# Patient Record
Sex: Male | Born: 2000 | Race: Black or African American | Hispanic: No | Marital: Single | Smoking: Never smoker
Health system: Southern US, Community
[De-identification: ages and names within clinical notes are randomized; demographics above are authoritative.]

---
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
## 2021-08-15 ENCOUNTER — Ambulatory Visit (LOCAL_COMMUNITY_HEALTH_CENTER): Payer: BC Managed Care – PPO

## 2021-08-15 ENCOUNTER — Other Ambulatory Visit: Payer: Self-pay

## 2021-08-15 DIAGNOSIS — Z23 Encounter for immunization: Secondary | ICD-10-CM | POA: Diagnosis not present

## 2021-08-15 DIAGNOSIS — Z719 Counseling, unspecified: Secondary | ICD-10-CM

## 2021-08-15 NOTE — Progress Notes (Signed)
?  Are you feeling sick today? No ? ? ?Have you ever received a dose of COVID-19 Vaccine? [employer], [place], [place], Novavax, Other) No ? ?If yes, which vaccine?  ?How many dose of COVID-19 vaccine were administered?    ? ? ?Did you bring the vaccination record card or other documentation?  No ? ? ?Does the person to be vaccinated have a health condition or is undergoing treatment that makes them moderately or severely immunocompromised? This would include, but not be limited to, treatment for cancer, HIV, receipt of organ transplant, immunosuppressive therapy or high-dose corticosteroids, CAR-T-cell therapy, hematopoietic cell transplant [HCT], or moderate or severe primary immunodeficiency.  No ? ?Has the person to be vaccinated received COVID-19 vaccine before or during hematopoietic cell transplant (HCT) or CAR-T-cell therapies? No ? ?Has the person to be vaccinated ever had an allergic reaction to: (This would include a severe allergic reaction [e.g., anaphylaxis] that required treatment with epinephrine or EpiPen? or that caused you to go to the hospital. It would also include an allergic reaction that caused hives, swelling, or respiratory distress, including wheezing.) A component of a COVID-19 vaccine or a previous dose of COVID-19 vaccine? No ? ? ?Has the person to be vaccinated ever had an allergic reaction to another vaccine (other thanCOVID-19 vaccine) or an injectable medication? (This would include a severe allergic reaction [e.g., anaphylaxis] that required treatment with epinephrine or EpiPen? or that caused youto go to the hospital. It would also include an allergic reaction that caused hives, swelling, or respiratory distress, including wheezing.)   No ?  ?Do you have a history of any of the following: ? ?Myocarditis or Pericarditis No ?Have a history of thrombosis with thrombocytopenia syndrome (TTS) No ?Multisystem Inflammatory Syndrome (MIS-C or MIS-A)? No ?Immune-mediate syndrome defined by  thrombosis and thrombocytopenia, such as heparin--induced thrombocytopenia (HIT)  No ?Have a history of Guillain-Barr? Syndrome (GBS) No ?Have a history of COVID-19 disease within the past 3 months? No ?Vaccinated with monkeypox vaccine in the last 4 weeks? No ? ?Pt came in to clinic for first covid vaccine. Pt prefers to have [place] after offering other options. Administered Janssen to (L) deltoid, monitored pt and tolerated well. Given a copy of covid 19 janssen vaccine information, record card and updated [facility]. M.Cattie Tineo, LPN ? ?  ?

## 2024-03-16 ENCOUNTER — Ambulatory Visit: Payer: Self-pay

## 2024-03-16 NOTE — Telephone Encounter (Signed)
 FYI Only or Action Required?: FYI only for provider.  Patient was last seen in primary care on unknown.  Called Nurse Triage reporting Hypertension.  Symptoms began yesterday.  Interventions attempted: Nothing.  Symptoms are: gradually worsening.  Triage Disposition: Go to ED Now (Notify PCP)  Patient/caregiver understands and will follow disposition?: Yes      Copied from CRM [id number]. Topic: Clinical - Red Word Triage >> Mar 16, 2024 12:22 PM Amy B wrote: Red Word that prompted transfer to Nurse Triage: High blood pressure 174/102.  Sent home from work, with nausea. Reason for Disposition  [1] Systolic BP >= 160 OR Diastolic >= 100 AND [2] cardiac (e.g., breathing difficulty, chest pain) or neurologic symptoms (e.g., new-onset blurred or double vision, unsteady gait)  Answer Assessment - Initial Assessment Questions This RN spoke with the patient's spouse regarding symptoms. New patient appointment established. Pt referred to ED for acute symptoms.   1. BLOOD PRESSURE: What is your blood pressure? Did you take at least two measurements 5 minutes apart?     Last night reading 172/106 , Today 174/102  2. ONSET: When did you take your blood pressure?     Today at work  3. HOW: How did you take your blood pressure? (e.g., automatic home BP monitor, visiting nurse)     At job  4. HISTORY: Do you have a history of high blood pressure?     No none history  5. MEDICINES: Are you taking any medicines for blood pressure? Have you missed any doses recently?     Not currently on BP meds  6. OTHER SYMPTOMS: Do you have any symptoms? (e.g., blurred vision, chest pain, difficulty breathing, headache, weakness)     Chest pain, abdominal pain and shortness of breath  Protocols used: Blood Pressure - High-A-AH

## 2024-03-25 ENCOUNTER — Encounter: Payer: Self-pay | Admitting: Nurse Practitioner

## 2024-05-18 ENCOUNTER — Encounter: Payer: Self-pay | Admitting: Nurse Practitioner

## 2024-05-18 ENCOUNTER — Ambulatory Visit: Admitting: Nurse Practitioner

## 2024-05-18 VITALS — BP 138/96 | HR 59 | Temp 98.0°F | Ht 66.0 in | Wt 237.0 lb

## 2024-05-18 DIAGNOSIS — Z1159 Encounter for screening for other viral diseases: Secondary | ICD-10-CM

## 2024-05-18 DIAGNOSIS — I10 Essential (primary) hypertension: Secondary | ICD-10-CM | POA: Diagnosis not present

## 2024-05-18 DIAGNOSIS — Z114 Encounter for screening for human immunodeficiency virus [HIV]: Secondary | ICD-10-CM | POA: Diagnosis not present

## 2024-05-18 DIAGNOSIS — E66812 Obesity, class 2: Secondary | ICD-10-CM | POA: Diagnosis not present

## 2024-05-18 DIAGNOSIS — Z6838 Body mass index (BMI) 38.0-38.9, adult: Secondary | ICD-10-CM | POA: Diagnosis not present

## 2024-05-18 DIAGNOSIS — Z7689 Persons encountering health services in other specified circumstances: Secondary | ICD-10-CM | POA: Diagnosis not present

## 2024-05-18 DIAGNOSIS — Z13220 Encounter for screening for lipoid disorders: Secondary | ICD-10-CM | POA: Diagnosis not present

## 2024-05-18 DIAGNOSIS — Z131 Encounter for screening for diabetes mellitus: Secondary | ICD-10-CM | POA: Diagnosis not present

## 2024-05-18 DIAGNOSIS — Z130 Encounter for screening for diseases of the blood and blood-forming organs and certain disorders involving the immune mechanism: Secondary | ICD-10-CM

## 2024-05-18 MED ORDER — AMLODIPINE BESYLATE 5 MG PO TABS: 5.0000 mg | ORAL_TABLET | Freq: Every day | ORAL | 0 refills | Status: DC

## 2024-05-18 NOTE — Progress Notes (Signed)
 BP (!) 138/96   Pulse (!) 59   Temp 98 F (36.7 C)   Ht 5' 6 (1.676 m)   Wt 237 lb (107.5 kg)   SpO2 98%   BMI 38.25 kg/m    Subjective:    Patient ID: Shawn Walker, male    DOB: 2001/01/16, 23 y.o.   MRN: [id number]  HPI: Shawn Walker is a 23 y.o. male  Chief Complaint  Patient presents with   Establish Care    Pt c/o hypertension.    Discussed the use of AI scribe software for clinical note transcription with the patient, who gave verbal consent to proceed.  History of Present Illness Shawn Walker is a 23 year old male with obesity and hypertension who presents to establish care.  Hypertension - Blood pressure readings consistently elevated, with initial in-office reading of 210/120 mmHg, later improving to 138/96 mmHg during the visit - Home blood pressure readings as high as 191/unknown and 171/unknown, with fluctuations throughout the day - Occasionally experiences feeling 'weird,' but often asymptomatic despite elevated blood pressure  Obesity - Current weight 237 pounds - Body mass index (BMI) 38.25 Wt Readings from Last 3 Encounters:  05/18/24 237 lb (107.5 kg)   Body mass index is 38.25 kg/m.  Flowsheet Row Office Visit from 05/18/2024 in [facility]  1 38 inches   - Encourage continuation of lifestyle modifications, including dietary management and regular exercise. -continue to increase physical activity, getting at least 150 min of physical activity a week.  Work on including runner, broadcasting/film/video 2 days a week.  - continue eating at a calorie deficit 2000-2200 cal a day, eating a well balanced diet with whole foods, avoiding processed foods.   Patient is motivated to continue working on lifestyle modification.           05/18/2024    9:14 AM  Depression screen PHQ 2/9  Decreased Interest 0  Down, Depressed, Hopeless 0  PHQ - 2 Score 0    Relevant past medical, surgical, family and social history reviewed and updated  as indicated. Interim medical history since our last visit reviewed. Allergies and medications reviewed and updated.  Review of Systems Constitutional: Negative for fever or weight change.  Respiratory: Negative for cough and shortness of breath.   Cardiovascular: Negative for chest pain or palpitations.  Gastrointestinal: Negative for abdominal pain, no bowel changes.  Musculoskeletal: Negative for gait problem or joint swelling.  Skin: Negative for rash.  Neurological: Negative for dizziness or headache.  No other specific complaints in a complete review of systems (except as listed in HPI above).      Objective:      BP (!) 138/96   Pulse (!) 59   Temp 98 F (36.7 C)   Ht 5' 6 (1.676 m)   Wt 237 lb (107.5 kg)   SpO2 98%   BMI 38.25 kg/m    Wt Readings from Last 3 Encounters:  05/18/24 237 lb (107.5 kg)    Physical Exam VITALS: BP- 138/96 MEASUREMENTS: Weight- 237, BMI- 38.25. GENERAL: Alert, cooperative, well developed, no acute distress HEENT: Normocephalic, normal oropharynx, moist mucous membranes CHEST: Clear to auscultation bilaterally, no wheezes, rhonchi, or crackles CARDIOVASCULAR: Normal heart rate and rhythm, S1 and S2 normal without murmurs ABDOMEN: Soft, non-tender, non-distended, without organomegaly, normal bowel sounds EXTREMITIES: No cyanosis or edema NEUROLOGICAL: Cranial nerves grossly intact, moves all extremities without gross motor or sensory deficit  No results found for  this or any previous visit.        Assessment & Plan:   Problem List Items Addressed This Visit       Cardiovascular and Mediastinum   Primary hypertension - Primary   Relevant Medications   amLODipine  (NORVASC ) 5 MG tablet   Other Relevant Orders   Microalbumin / creatinine urine ratio   Aldosterone + renin activity w/ ratio   EKG 12-Lead     Other   Class 2 severe obesity due to excess calories with serious comorbidity and body mass index (BMI) of 38.0 to 38.9  in adult   Relevant Orders   TSH   Other Visit Diagnoses       Screening for deficiency anemia       Relevant Orders   CBC with Differential/Platelet     Screening for cholesterol level       Relevant Orders   Lipid panel     Screening for diabetes mellitus       Relevant Orders   Comprehensive metabolic panel with GFR   Hemoglobin A1c     Encounter for hepatitis C screening test for low risk patient       Relevant Orders   Hepatitis C antibody     Screening for HIV without presence of risk factors       Relevant Orders   HIV Antibody (routine testing w rflx)     Encounter to establish care            Assessment and Plan Assessment & Plan Primary hypertension Hypertension with initial blood pressure of 210/120 mmHg, improved to 138/96 mmHg. Family history of hypertension. Asymptomatic but at risk for cardiovascular damage. EKG showed sinus bradycardia but otherwise normal. - Prescribed low-dose amlodipine  to reduce cardiac workload. - Ordered blood work at [facility]. - Performed EKG to establish baseline cardiac function. - Scheduled follow-up appointment in 4 weeks to reassess blood pressure and review blood work results.  Class 2 severe obesity BMI of 38.25, indicating class 2 severe obesity. - Encourage continuation of lifestyle modifications, including dietary management and regular exercise. -continue to increase physical activity, getting at least 150 min of physical activity a week.  Work on including runner, broadcasting/film/video 2 days a week.  - continue eating at a calorie deficit 2000-2200 cal a day, eating a well balanced diet with whole foods, avoiding processed foods.   Patient is motivated to continue working on lifestyle modification.    General health maintenance Discussion of the importance of monitoring blood pressure at home and the silent nature of hypertension. - Advised monitoring blood pressure at home.        Follow up plan: Return in about 4 weeks  (around 06/15/2024) for follow up blood pressure.

## 2024-05-27 LAB — COMPREHENSIVE METABOLIC PANEL WITH GFR
ALT: 26 IU/L (ref 0–44)
AST: 28 IU/L (ref 0–40)
Albumin: 4.9 g/dL (ref 4.3–5.2)
Alkaline Phosphatase: 58 IU/L (ref 47–123)
BUN/Creatinine Ratio: 13 (ref 9–20)
BUN: 15 mg/dL (ref 6–20)
Bilirubin Total: 0.9 mg/dL (ref 0.0–1.2)
CO2: 24 mmol/L (ref 20–29)
Calcium: 9.9 mg/dL (ref 8.7–10.2)
Chloride: 104 mmol/L (ref 96–106)
Creatinine, Ser: 1.2 mg/dL (ref 0.76–1.27)
Globulin, Total: 2.7 g/dL (ref 1.5–4.5)
Glucose: 91 mg/dL (ref 70–99)
Potassium: 4.6 mmol/L (ref 3.5–5.2)
Sodium: 141 mmol/L (ref 134–144)
Total Protein: 7.6 g/dL (ref 6.0–8.5)
eGFR: 88 mL/min/1.73

## 2024-05-27 LAB — CBC WITH DIFFERENTIAL/PLATELET
Basophils Absolute: 0 x10E3/uL (ref 0.0–0.2)
Basos: 0 %
EOS (ABSOLUTE): 0.1 x10E3/uL (ref 0.0–0.4)
Eos: 3 %
Hematocrit: 52.6 % — ABNORMAL HIGH (ref 37.5–51.0)
Hemoglobin: 17 g/dL (ref 13.0–17.7)
Immature Grans (Abs): 0 x10E3/uL (ref 0.0–0.1)
Immature Granulocytes: 0 %
Lymphocytes Absolute: 1.8 x10E3/uL (ref 0.7–3.1)
Lymphs: 33 %
MCH: 26.7 pg (ref 26.6–33.0)
MCHC: 32.3 g/dL (ref 31.5–35.7)
MCV: 83 fL (ref 79–97)
Monocytes Absolute: 0.5 x10E3/uL (ref 0.1–0.9)
Monocytes: 8 %
Neutrophils Absolute: 3.1 x10E3/uL (ref 1.4–7.0)
Neutrophils: 56 %
Platelets: 225 x10E3/uL (ref 150–450)
RBC: 6.36 x10E6/uL — ABNORMAL HIGH (ref 4.14–5.80)
RDW: 13.8 % (ref 11.6–15.4)
WBC: 5.5 x10E3/uL (ref 3.4–10.8)

## 2024-05-27 LAB — ALDOSTERONE + RENIN ACTIVITY W/ RATIO
Aldos/Renin Ratio: 1.1 (ref 0.0–30.0)
Aldosterone: 2.2 ng/dL (ref 0.0–30.0)
Renin Activity, Plasma: 2.029 ng/mL/h (ref 0.167–5.380)

## 2024-05-27 LAB — MICROALBUMIN / CREATININE URINE RATIO
Creatinine, Urine: 123.8 mg/dL
Microalb/Creat Ratio: <2 mg/g{creat} (ref 0–29)
Microalbumin, Urine: <3 ug/mL

## 2024-05-27 LAB — HIV ANTIBODY (ROUTINE TESTING W REFLEX): HIV Screen 4th Generation wRfx: NONREACTIVE

## 2024-05-27 LAB — LIPID PANEL
Chol/HDL Ratio: 4.2 ratio (ref 0.0–5.0)
Cholesterol, Total: 245 mg/dL — ABNORMAL HIGH (ref 100–199)
HDL: 58 mg/dL
LDL Chol Calc (NIH): 173 mg/dL — ABNORMAL HIGH (ref 0–99)
Triglycerides: 82 mg/dL (ref 0–149)
VLDL Cholesterol Cal: 14 mg/dL (ref 5–40)

## 2024-05-27 LAB — HEMOGLOBIN A1C
Est. average glucose Bld gHb Est-mCnc: 128 mg/dL
Hgb A1c MFr Bld: 6.1 % — ABNORMAL HIGH (ref 4.8–5.6)

## 2024-05-27 LAB — HEPATITIS C ANTIBODY: Hep C Virus Ab: NONREACTIVE

## 2024-05-27 LAB — TSH: TSH: 0.922 u[IU]/mL (ref 0.450–4.500)

## 2024-06-20 ENCOUNTER — Encounter: Payer: Self-pay | Admitting: Nurse Practitioner

## 2024-06-20 ENCOUNTER — Ambulatory Visit (INDEPENDENT_AMBULATORY_CARE_PROVIDER_SITE_OTHER): Admitting: Nurse Practitioner

## 2024-06-20 VITALS — BP 134/94 | HR 83 | Temp 98.0°F | Resp 18 | Ht 66.0 in | Wt 236.7 lb

## 2024-06-20 DIAGNOSIS — R051 Acute cough: Secondary | ICD-10-CM | POA: Diagnosis not present

## 2024-06-20 DIAGNOSIS — I10 Essential (primary) hypertension: Secondary | ICD-10-CM

## 2024-06-20 LAB — POC COVID19/FLU A&B COMBO
Covid Antigen, POC: NEGATIVE
Influenza A Antigen, POC: POSITIVE — AB
Influenza B Antigen, POC: NEGATIVE

## 2024-06-20 MED ORDER — HYDROCHLOROTHIAZIDE 12.5 MG PO TABS: 12.5000 mg | ORAL_TABLET | Freq: Every day | ORAL | 1 refills | Status: AC

## 2024-06-20 MED ORDER — AMLODIPINE BESYLATE 5 MG PO TABS: 5.0000 mg | ORAL_TABLET | Freq: Every day | ORAL | 1 refills | Status: AC

## 2024-06-20 MED ORDER — OSELTAMIVIR PHOSPHATE 75 MG PO CAPS: 75.0000 mg | ORAL_CAPSULE | Freq: Two times a day (BID) | ORAL | 0 refills | Status: AC

## 2024-06-20 NOTE — Progress Notes (Signed)
 "  BP (!) 134/94   Pulse 83   Temp 98 F (36.7 C)   Resp 18   Ht 5' 6 (1.676 m)   Wt 236 lb 11.2 oz (107.4 kg)   SpO2 98%   BMI 38.20 kg/m    Subjective:    Patient ID: Shawn Walker, male    DOB: [birth year], 24 y.o.   MRN: [id number]  HPI: Shawn Walker is a 24 y.o. male  Chief Complaint  Patient presents with   Medical Management of Chronic Issues   Hypertension   Discussed the use of AI scribe software for clinical note transcription with the patient, who gave verbal consent to proceed.  History of Present Illness [name] is a 24 year old male with hypertension who presents for a four-week follow-up.  Hypertension - Currently taking amlodipine  5 mg daily. - Home blood pressure readings average 138/96 mmHg, consistent with today's office reading. - Occasionally forgets morning dose due to busy schedule, resulting in increased awareness of heart pounding. - Resumes medication the following day and symptoms improve.  Acute upper respiratory symptoms - Onset of symptoms since Friday, including rhinorrhea and headache. - No fever or significant cough. - Initiated Tamiflu  early due to feeling unwell. - Acetaminophen provided temporary relief of headache. - Using Zycam (zinc supplement) with minimal effect.  Gastrointestinal symptoms - Experienced nausea after consuming pizza at a friend's house. - Attributes nausea to dairy intake while ill.  Impact on daily functioning - Works on his feet for approximately nine hours daily. - Concerned about the effect of illness on ability to work.         06/20/2024    9:08 AM 05/18/2024    9:14 AM  Depression screen PHQ 2/9  Decreased Interest 0 0  Down, Depressed, Hopeless 0 0  PHQ - 2 Score 0 0  Altered sleeping 0   Tired, decreased energy 0   Change in appetite 0   Feeling bad or failure about yourself  0   Trouble concentrating 0   Moving slowly or fidgety/restless 0   Suicidal thoughts 0   PHQ-9 Score 0    Difficult doing work/chores Not difficult at all     Relevant past medical, surgical, family and social history reviewed and updated as indicated. Interim medical history since our last visit reviewed. Allergies and medications reviewed and updated.  Review of Systems  Ten systems reviewed and is negative except as mentioned in HPI      Objective:      BP (!) 134/94   Pulse 83   Temp 98 F (36.7 C)   Resp 18   Ht 5' 6 (1.676 m)   Wt 236 lb 11.2 oz (107.4 kg)   SpO2 98%   BMI 38.20 kg/m    Wt Readings from Last 3 Encounters:  06/20/24 236 lb 11.2 oz (107.4 kg)  05/18/24 237 lb (107.5 kg)    Physical Exam VITALS: BP- 138/96 GENERAL: Alert, cooperative, well developed, no acute distress. HEENT: Normocephalic, normal oropharynx, moist mucous membranes. CHEST: Clear to auscultation bilaterally, no wheezes, rhonchi, or crackles. CARDIOVASCULAR: Normal heart rate and rhythm, S1 and S2 normal without murmurs. ABDOMEN: Soft, non-tender, non-distended, without organomegaly, normal bowel sounds. EXTREMITIES: No cyanosis or edema. NEUROLOGICAL: Cranial nerves grossly intact, moves all extremities without gross motor or sensory deficit.  Results for orders placed or performed in visit on 06/20/24  POC Covid19/Flu A&B Antigen   Collection Time: 06/20/24  9:34 AM  Result Value Ref Range   Influenza A Antigen, POC Positive (A) Negative   Influenza B Antigen, POC Negative Negative   Covid Antigen, POC Negative Negative          Assessment & Plan:   Problem List Items Addressed This Visit       Cardiovascular and Mediastinum   Primary hypertension   Relevant Medications   hydrochlorothiazide  (HYDRODIURIL ) 12.5 MG tablet   amLODipine  (NORVASC ) 5 MG tablet   Other Visit Diagnoses       Acute cough    -  Primary   Relevant Medications   oseltamivir  (TAMIFLU ) 75 MG capsule   Other Relevant Orders   POC Covid19/Flu A&B Antigen (Completed)        Assessment and  Plan Assessment & Plan Primary hypertension Blood pressure is 138/96 mmHg, not at target. Amlodipine  5 mg daily is effective but requires consistent adherence. Tamiflu  may contribute to elevated readings. He prefers to avoid leg swelling associated with increased amlodipine  dosage. - Added hydrochlorothiazide  12.5 mg daily in the morning to amlodipine  5 mg daily. - Monitor blood pressure at home. - Scheduled follow-up in 3 months with lab tests.  Influenza A infection Confirmed with symptoms of runny nose, headache, and sinus congestion. No fever or significant cough. Tamiflu  initiated for treatment. Discussed potential side effects and importance of hydration. - Continue Tamiflu  twice daily for 5 days. - Use maximum strength Mucinex 100 mg in the morning and at night. - Take Zyrtec or Claritin for sinus congestion. - Use Flonase nasal spray. - Avoid dairy products to prevent mucus thickening. - Ensure adequate hydration.        Follow up plan: Return in about 3 months (around 09/18/2024) for follow up. "

## 2024-09-19 ENCOUNTER — Ambulatory Visit: Admitting: Nurse Practitioner
